# Patient Record
Sex: Female | Born: 2005 | Race: Black or African American | Hispanic: Yes | Marital: Single
Health system: Southern US, Community
[De-identification: ages and names within clinical notes are randomized; demographics above are authoritative.]

---
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
## 2023-07-18 ENCOUNTER — Other Ambulatory Visit: Payer: Self-pay

## 2023-07-18 ENCOUNTER — Encounter (HOSPITAL_COMMUNITY): Payer: Self-pay

## 2023-07-18 ENCOUNTER — Emergency Department (HOSPITAL_COMMUNITY)
Admission: EM | Admit: 2023-07-18 | Discharge: 2023-07-19 | Disposition: A | Discharge status: Home / Self Care | Payer: Self-pay | Attending: Emergency Medicine | Admitting: Emergency Medicine

## 2023-07-18 DIAGNOSIS — L309 Dermatitis, unspecified: Secondary | ICD-10-CM

## 2023-07-18 DIAGNOSIS — L209 Atopic dermatitis, unspecified: Secondary | ICD-10-CM | POA: Insufficient documentation

## 2023-07-18 NOTE — ED Triage Notes (Signed)
 Pt reports she has been suffering from an allergic reaction for the last 2 months. She reports dry eyes, redness around her eyes, itchiness, puffiness, and burning when she cries. Unknown source of reaction. She has not been seen for this. She has been taking Benadryl. Denies trouble swallowing. She is talking in clear complete sentences.

## 2023-07-19 MED ORDER — CETIRIZINE HCL 10 MG PO TABS: 10.0000 mg | ORAL_TABLET | Freq: Every day | ORAL | 0 refills | Status: AC

## 2023-07-19 MED ORDER — HYDROCORTISONE 1 % EX CREA: TOPICAL_CREAM | CUTANEOUS | 0 refills | Status: AC

## 2023-07-19 MED ORDER — KETOTIFEN FUMARATE 0.035 % OP SOLN: 1.0000 [drp] | Freq: Two times a day (BID) | OPHTHALMIC | 0 refills | Status: AC

## 2023-07-19 NOTE — ED Provider Notes (Signed)
 [place] EMERGENCY DEPARTMENT AT [facility] Provider Note   CSN: [id number] Arrival date & time: 07/18/23  2308     Patient presents with: Allergic Reaction   Alexis George is a 18 y.o. female.   Patient is a 18 year old female with no known past medical history presenting to the emergency department with concern of allergic reaction.  Patient states that since April 10 she has developed a rash and itching around her bilateral eyes.  She states that her eyes itch as well and burns when she cries.  She states that she has had no changes to any of her moisturizers or face wash.  She states that she has had no rash anywhere else.  She denies any associated runny nose, cough, sore throat or shortness of breath.  She states that she has been trying Aquaphor without significant improvement.  The history is provided by the patient and a friend.  Allergic Reaction      Prior to Admission medications   Medication Sig Start Date End Date Taking? Authorizing Provider  cetirizine (ZYRTEC ALLERGY) 10 MG tablet Take 1 tablet (10 mg total) by mouth daily. 07/19/23  Yes Kingsley, Idelle Reimann K, DO  hydrocortisone cream 1 % Apply to affected area 2 times daily 07/19/23  Yes Nora Beal, Tryson Lumley K, DO  ketotifen (ZADITOR) 0.035 % ophthalmic solution Place 1 drop into both eyes in the morning and at bedtime. 07/19/23  Yes Kingsley, Baylei Siebels K, DO    Allergies: Patient has no known allergies.    Review of Systems  Updated Vital Signs BP 110/67 (BP Location: Left Arm)   Pulse 72   Temp 98.3 F (36.8 C) (Oral)   Resp 17   Ht 5' 3 (1.6 m)   Wt 81.6 kg   LMP 06/13/2023 (Approximate)   SpO2 100%   BMI 31.89 kg/m   Physical Exam Vitals and nursing note reviewed.  Constitutional:      General: She is not in acute distress.    Appearance: Normal appearance.  HENT:     Head: Normocephalic and atraumatic.     Nose: Nose normal.     Mouth/Throat:     Mouth: Mucous membranes are moist.      Pharynx: Oropharynx is clear.   Eyes:     Extraocular Movements: Extraocular movements intact.     Conjunctiva/sclera: Conjunctivae normal.     Pupils: Pupils are equal, round, and reactive to light.     Comments: Dry scaling rash periorbitally bilaterally, no erythema, warmth or drainage   Cardiovascular:     Rate and Rhythm: Normal rate and regular rhythm.     Heart sounds: Normal heart sounds.  Pulmonary:     Effort: Pulmonary effort is normal.     Breath sounds: Normal breath sounds.  Abdominal:     General: Abdomen is flat.   Musculoskeletal:     Cervical back: Normal range of motion.   Skin:    General: Skin is warm and dry.     Findings: Rash (Additional dry scaling rash to skin, no rash on extremities) present.   Neurological:     Mental Status: She is alert and oriented to person, place, and time.   Psychiatric:        Mood and Affect: Mood normal.        Behavior: Behavior normal.     (all labs ordered are listed, but only abnormal results are displayed) Labs Reviewed - No data to display  EKG: None  Radiology:  No results found.   Procedures   Medications Ordered in the ED - No data to display                                  Medical Decision Making This patient presents to the ED with chief complaint(s) of allergic reaction with no pertinent past medical history which further complicates the presenting complaint. The complaint involves an extensive differential diagnosis and also carries with it a high risk of complications and morbidity.    The differential diagnosis includes allergic conjunctivitis, eczema,  contact dermatitis, no signs of hives or anaphylaxis on exam  Additional history obtained: Additional history obtained from friend Records reviewed N/A  ED Course and Reassessment: On patient's arrival she is hemodynamically stable in no acute distress.  Does have a dry scaling rash around her eyes, no significant conjunctival injection.   Rash does appear consistent with an eczema type rash.  She will be started on daily Zyrtec, given steroid cream and recommended Aquaphor for moisturizer as well as unscented face wash.  She will be given primary care follow-up and dermatology as needed.  She was given strict return precautions.  Independent labs interpretation:  N/A  Independent visualization of imaging: - N/A  Consultation: - Consulted or discussed management/test interpretation w/ external professional: N/A  Consideration for admission or further workup: Patient has no emergent conditions requiring admission or further work-up at this time and is stable for discharge home with primary care follow-up  Social Determinants of health: no PCP         Final diagnoses:  Eczema of face    ED Discharge Orders          Ordered    hydrocortisone cream 1 %        07/19/23 0808    cetirizine (ZYRTEC ALLERGY) 10 MG tablet  Daily        07/19/23 0808    ketotifen (ZADITOR) 0.035 % ophthalmic solution  2 times daily        07/19/23 0808               Kingsley, Chieko Neises K, DO 07/19/23 [phone]

## 2023-07-19 NOTE — ED Notes (Signed)
 Alert, NAD, calm, interactive. Endorses periorbital,eye and facial itching, burning, irritation, inflammation. Ongoing since April. Benadryl helped initially, but no longer helping. No other meds taken. No h/o same. No known culprit. Airway intact. No oral/ airway sx, no oral swelling or difficulty breathing or swallowing. Orophaynx unremarkable.

## 2023-07-19 NOTE — Discharge Instructions (Addendum)
 You were seen in the emergency department for your rash. This appears consistent with eczema, an allergic type of rash.  1. Medications: Zyrtec for allergy symptoms of sneezing and runny nose everyday, hydrocortisone cream for itching of rash, itchy eye drops 2. Treatment: rest, drink plenty of fluids, shower with cool water (no hot steamy showers); use soap only on your bits (underarms and bottom); use cetaphil cleanser or dove soap when using soap; use eucerin or aquaphor lotion after every bath/shower. Moisturize your face with aquaphor or Vaseline. 3. Follow Up: Please followup with your primary doctor in 1 week for discussion of your diagnoses and further evaluation after today's visit; if you do not have a primary care doctor use the resource guide provided to find one; Also follow-[facility] needed with [facility] as listed in your discharge instructions.  Return to the ER for difficulty breathing, high fevers, signs of infection or other concerning symptoms.
